# Patient Record
Sex: Male | Born: 2000 | Race: White | Hispanic: No | Marital: Single | Smoking: Never smoker
Health system: Southern US, Community
[De-identification: ages and names within clinical notes are randomized; demographics above are authoritative.]

## PROBLEM LIST (undated history)

## (undated) DIAGNOSIS — J45909 Unspecified asthma, uncomplicated: Secondary | ICD-10-CM

---
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
## 2020-03-18 IMAGING — CT CT HEAD WITHOUT CONTRAST
3 of 4 series · 16 of 47 positions shown, 19 images · non-contrast
Comparison: None.

CLINICAL DATA: Seizure-like activity.

EXAM:
CT HEAD WITHOUT CONTRAST
TECHNIQUE: Contiguous axial images were obtained from the base of the skull
through the vertex without intravenous contrast.

[Series 4: head 2.0 h70h · axial · 0.45mm/px · z∈[-109,+37]mm · 10 of 83 slices shown, 13 images]
[im 5/83  brain]
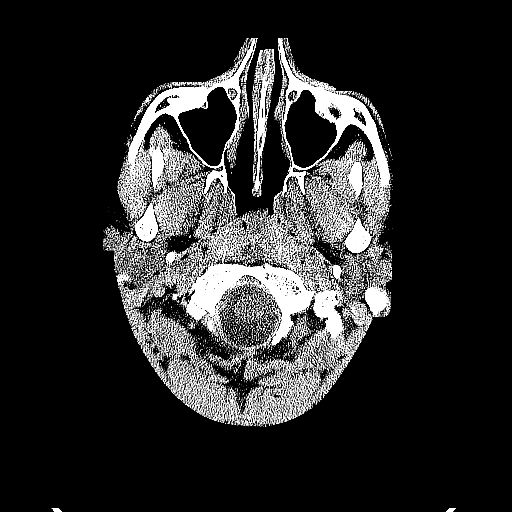
[im 5/83  bone]
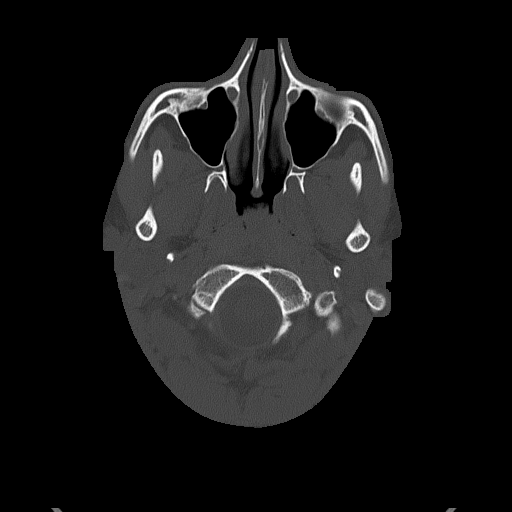
[im 13/83  brain]
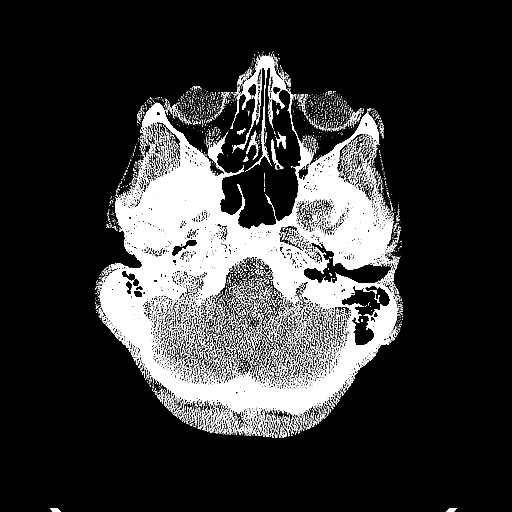
[im 21/83  brain]
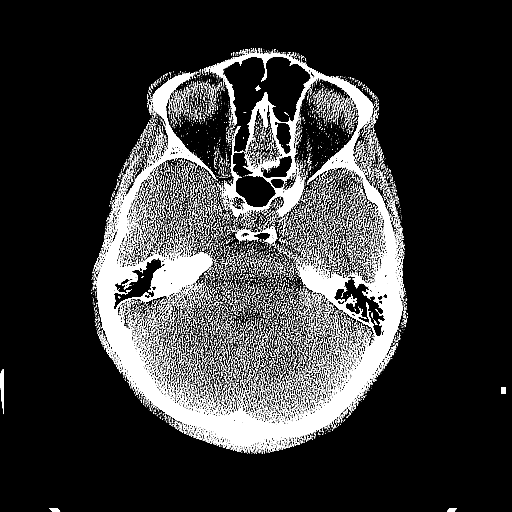
[im 29/83  brain]
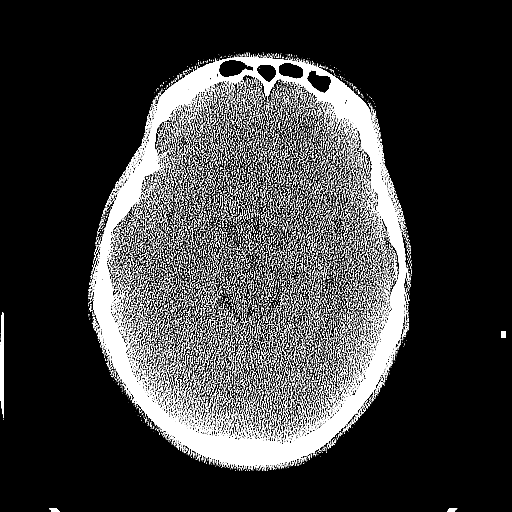
[im 37/83  brain]
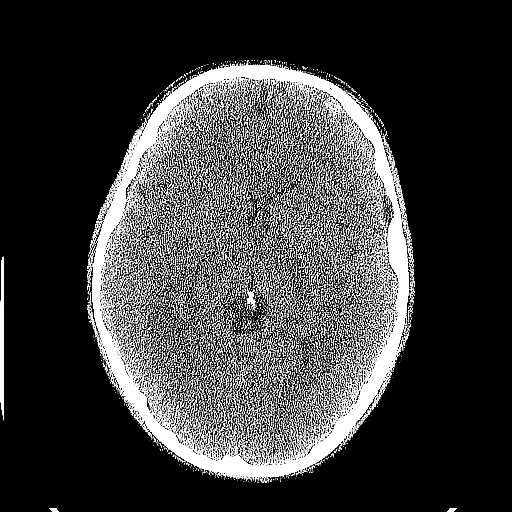
[im 37/83  bone]
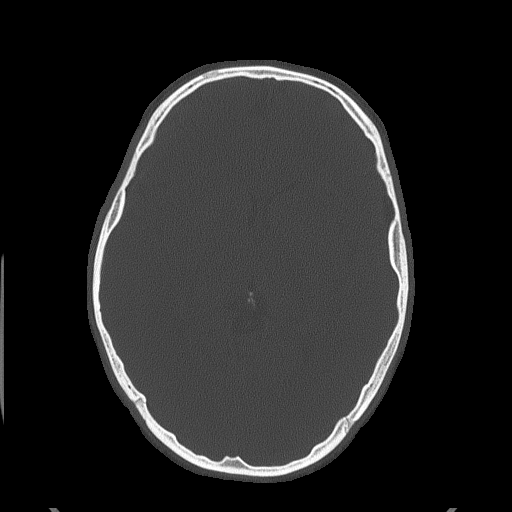
[im 46/83  brain]
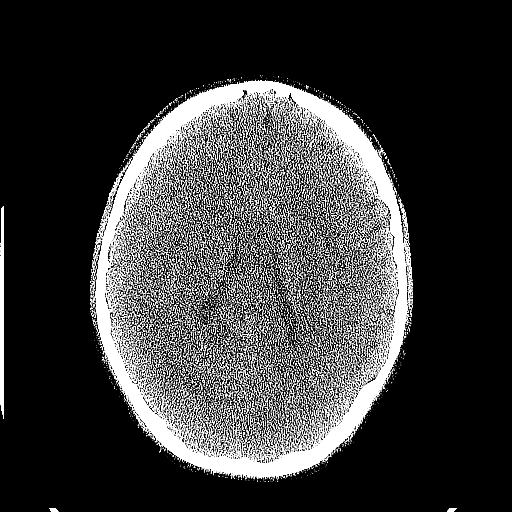
[im 54/83  brain]
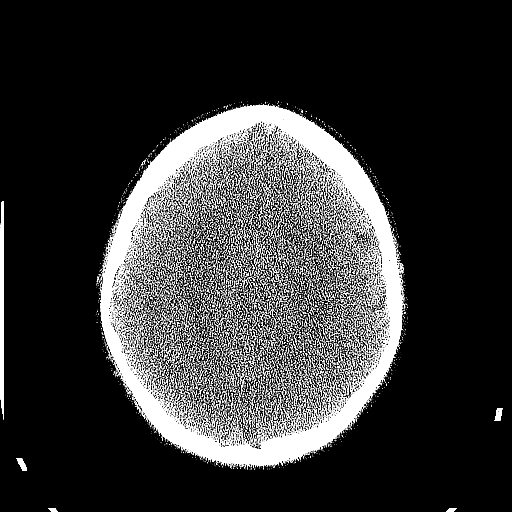
[im 62/83  brain]
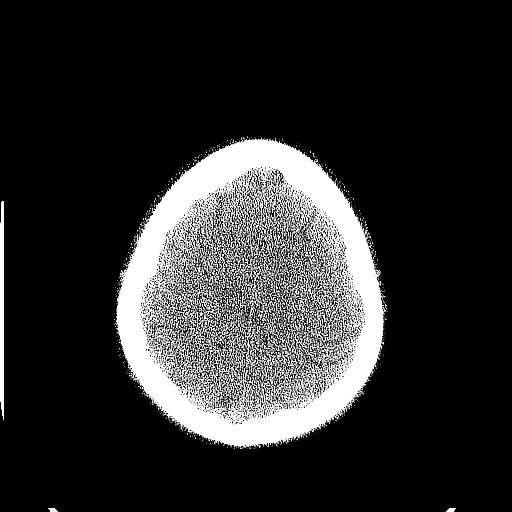
[im 70/83  brain]
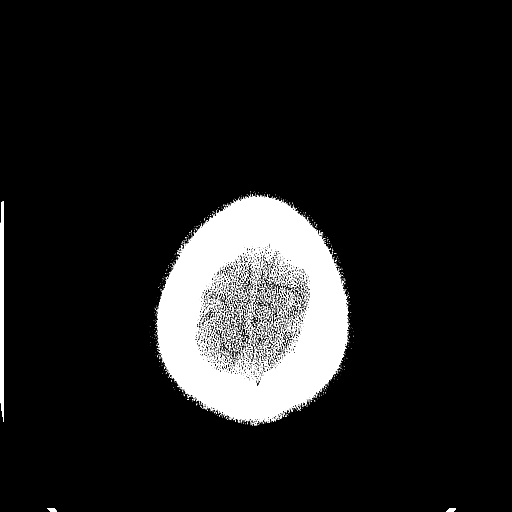
[im 70/83  bone]
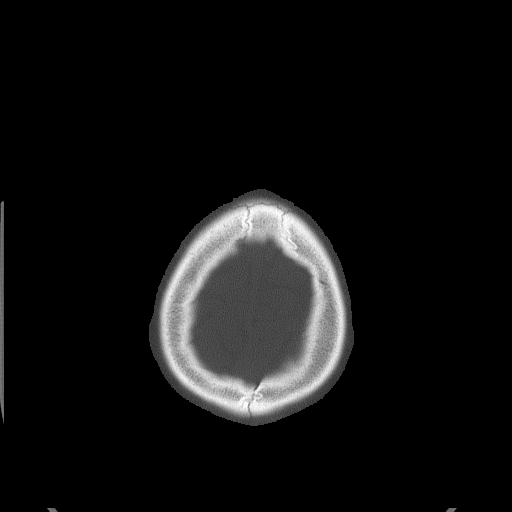
[im 78/83  brain]
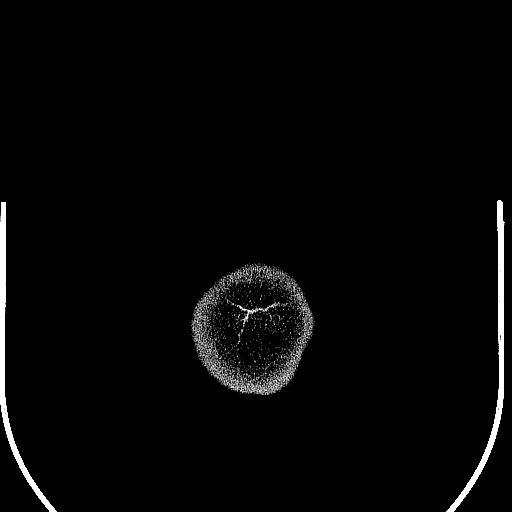

[Series 5: head 3.0 mpr cor · coronal · 0.34mm/px · 3 of 69 slices shown]
[im 23/69  brain]
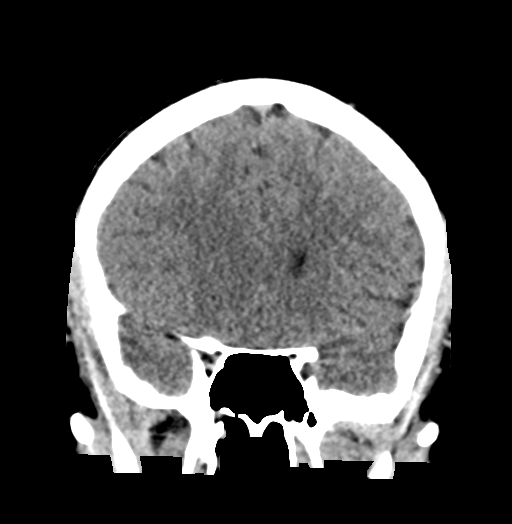
[im 31/69  brain]
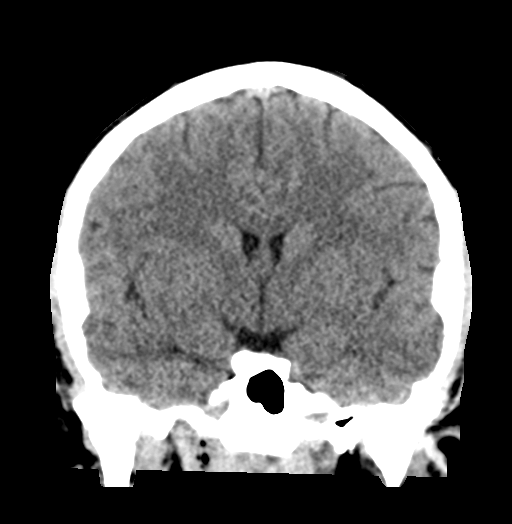
[im 38/69  brain]
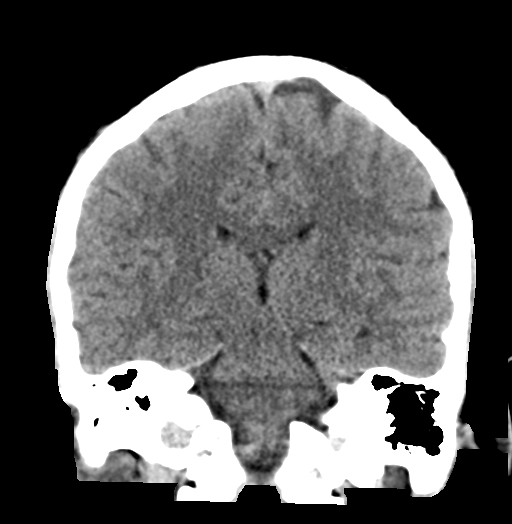

[Series 6: head 3.0 mpr sag · sagittal · 0.32mm/px · 3 of 67 slices shown]
[im 23/67  brain]
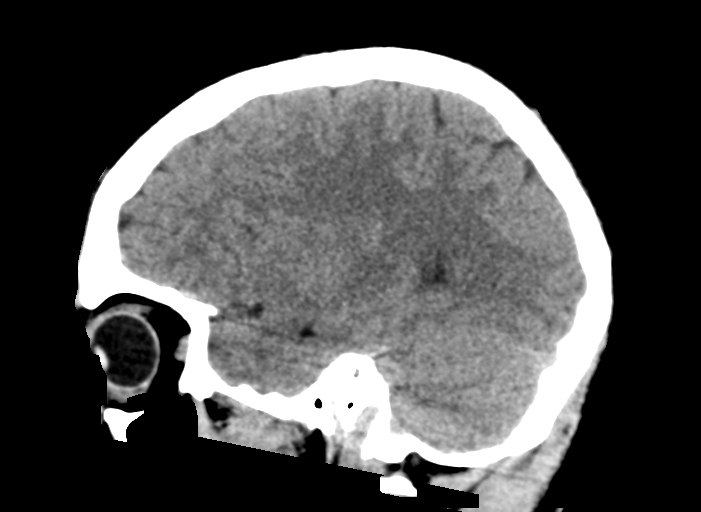
[im 34/67  brain]
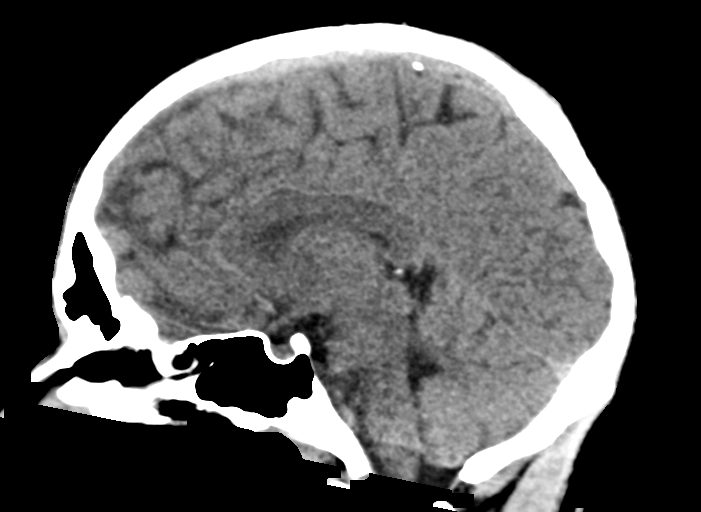
[im 45/67  brain]
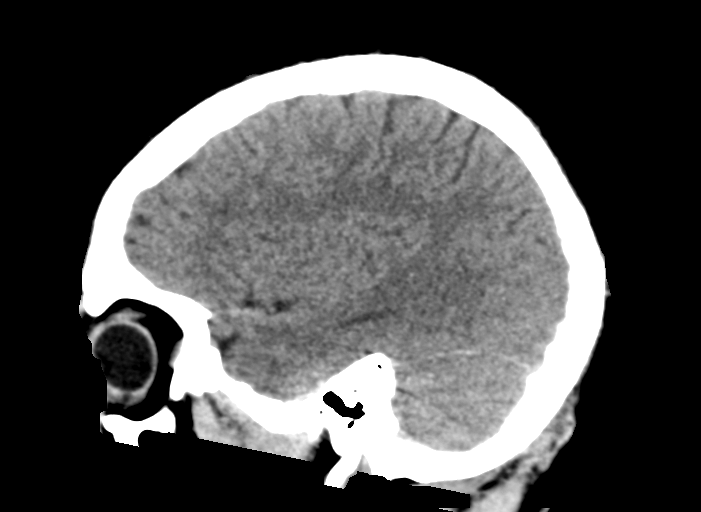

[16 of 47 positions shown; findings below may reference images not displayed]

FINDINGS: Brain: No evidence of acute infarction, hemorrhage, hydrocephalus,
extra-axial collection or mass lesion/mass effect.

Vascular: No hyperdense vessel or unexpected calcification.

Skull: Normal. Negative for fracture or focal lesion.

Sinuses/Orbits: No acute finding.

Other: None.
IMPRESSION: 1. Normal noncontrast head CT.
